# Patient Record
Sex: Female | Born: 2000 | Race: White | Hispanic: No | Marital: Single | Smoking: Never smoker
Health system: Southern US, Community
[De-identification: ages and names within clinical notes are randomized; demographics above are authoritative.]

## PROBLEM LIST (undated history)

## (undated) DIAGNOSIS — J3503 Chronic tonsillitis and adenoiditis: Secondary | ICD-10-CM

---
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
## 2019-01-19 ENCOUNTER — Encounter: Payer: Self-pay | Admitting: *Deleted

## 2019-01-19 ENCOUNTER — Ambulatory Visit (INDEPENDENT_AMBULATORY_CARE_PROVIDER_SITE_OTHER): Payer: Medicaid Other | Admitting: Surgery

## 2019-01-19 ENCOUNTER — Other Ambulatory Visit: Payer: Self-pay

## 2019-01-19 ENCOUNTER — Encounter: Payer: Self-pay | Admitting: Surgery

## 2019-01-19 VITALS — BP 122/80 | HR 80 | Temp 97.8°F | Ht 65.0 in | Wt 208.0 lb

## 2019-01-19 DIAGNOSIS — K8020 Calculus of gallbladder without cholecystitis without obstruction: Secondary | ICD-10-CM | POA: Diagnosis not present

## 2019-01-19 NOTE — H&P (View-Only) (Signed)
  01/19/2019  History of Present Illness: Laura Osborn is a 18 y.o. female presenting for follow up of symptomatic cholelithiasis.  She was seen 03/2018 for initial visit and had wanted to wait until 06/2018 for surgery.  She reports she was too busy with school and working and could not arrange for surgery.  Also, this spring, her symptoms have become more frequent and more severe, with episodes about 1-2 times every 1-2 weeks.  Describes associated nausea, infrequent emesis, and pain in the epigastric and RUQ areas radiating to the back.  Denies any fevers, chills, chest pain, shortness of breath.  Past Medical History: Past Medical History:  Diagnosis Date  . Chronic tonsillitis and adenoiditis      Past Surgical History: Past Surgical History:  Procedure Laterality Date  . TONSILLECTOMY N/A 04/02/2016   Procedure: TONSILLECTOMY;  Surgeon: Paul Bennett, MD;  Location: [facility];  Service: ENT;  Laterality: N/A;  NO ADENOID TISSUE PRESENT    Home Medications: Prior to Admission medications   Medication Sig Start Date End Date Taking? Authorizing Provider  ondansetron (ZOFRAN ODT) 4 MG disintegrating tablet Allow 1-2 tablets to dissolve in your mouth every 8 hours as needed for nausea/vomiting 03/09/18   Forbach, Cory, MD  traMADol (ULTRAM) 50 MG tablet Take 1-2 tablets by mouth every 6 hours as needed for moderate to severe pain 03/09/18   Forbach, Cory, MD    Allergies: Allergies  Allergen Reactions  . Lactose Intolerance (Gi)     Review of Systems: Review of Systems  Constitutional: Negative for chills and fever.  Respiratory: Negative for shortness of breath.   Cardiovascular: Negative for chest pain.  Gastrointestinal: Positive for abdominal pain, nausea and vomiting.    Physical Exam BP 122/80   Pulse 80   Temp 97.8 F (36.6 C) (Skin)   Ht 5' 5" (1.651 m)   Wt 208 lb (94.3 kg)   SpO2 99%   BMI 34.61 kg/m  CONSTITUTIONAL: No acute distress HEENT:   Normocephalic, atraumatic, extraocular motion intact. RESPIRATORY:  Lungs are clear, and breath sounds are equal bilaterally. Normal respiratory effort without pathologic use of accessory muscles. CARDIOVASCULAR: Heart is regular without murmurs, gallops, or rubs. GI: The abdomen is soft, obese, non-distended, with mild soreness in epigastric area.  Negative Murphy's sign.  NEUROLOGIC:  Motor and sensation is grossly normal.  Cranial nerves are grossly intact. PSYCH:  Alert and oriented to person, place and time. Affect is normal.  Labs/Imaging: None recently  Assessment and Plan: This is a 18 y.o. female with symptomatic cholelithiasis.  Discussed with the patient that given the increased frequency and severity of symptoms, we should proceed with cholecystectomy.  Discussed the surgery at length with her, including risks of bleeding, infection, and injury to surrounding structures; post-op restrictions, outcomes, and pain medications.  She is willing to proceed.  She understands that she would need to get tested for COVID-19 prior to surgery and if positive, would need to postpone surgery for 3 weeks.  Will schedule her for 01/25/19.  Face-to-face time spent with the patient and care providers was 25 minutes, with more than 50% of the time spent counseling, educating, and coordinating care of the patient.     Aubrey Blackard Luis Nadiya Pieratt, MD [facility]    

## 2019-01-19 NOTE — Progress Notes (Signed)
Patient's surgery to be scheduled for 01-25-19 at [facility] with Dr. Hampton Abbot.  The patient is aware to have COVID-19 testing done on 01-21-19 at the [facility] drive thru ([address] [place]) between 8:00 am and 10:30 am. She is aware to isolate after, have no visitors, wash hands frequently, and avoid touching face.   The patient is aware she will be contacted by the [place] to complete a phone interview sometime in the near future.  Patient aware to be NPO after midnight and have a driver.   She is aware to check in at the [place] entrance where she will be screened for the coronavirus and then sent to Same Day Surgery.   Patient aware that she may have one visitor due to COVID-19 restrictions.   The patient verbalizes understanding of the above.   The patient is aware to call the office should she have further questions.

## 2019-01-19 NOTE — Progress Notes (Signed)
  01/19/2019  History of Present Illness: Laura Osborn is a 18 y.o. female presenting for follow up of symptomatic cholelithiasis.  She was seen 03/2018 for initial visit and had wanted to wait until 06/2018 for surgery.  She reports she was too busy with school and working and could not arrange for surgery.  Also, this spring, her symptoms have become more frequent and more severe, with episodes about 1-2 times every 1-2 weeks.  Describes associated nausea, infrequent emesis, and pain in the epigastric and RUQ areas radiating to the back.  Denies any fevers, chills, chest pain, shortness of breath.  Past Medical History: Past Medical History:  Diagnosis Date  . Chronic tonsillitis and adenoiditis      Past Surgical History: Past Surgical History:  Procedure Laterality Date  . TONSILLECTOMY N/A 04/02/2016   Procedure: TONSILLECTOMY;  Surgeon: Clyde Canterbury, MD;  Location: [facility];  Service: ENT;  Laterality: N/A;  NO ADENOID TISSUE PRESENT    Home Medications: Prior to Admission medications   Medication Sig Start Date End Date Taking? Authorizing Provider  ondansetron (ZOFRAN ODT) 4 MG disintegrating tablet Allow 1-2 tablets to dissolve in your mouth every 8 hours as needed for nausea/vomiting 03/09/18   Hinda Kehr, MD  traMADol Veatrice Bourbon) 50 MG tablet Take 1-2 tablets by mouth every 6 hours as needed for moderate to severe pain 03/09/18   Hinda Kehr, MD    Allergies: Allergies  Allergen Reactions  . Lactose Intolerance (Gi)     Review of Systems: Review of Systems  Constitutional: Negative for chills and fever.  Respiratory: Negative for shortness of breath.   Cardiovascular: Negative for chest pain.  Gastrointestinal: Positive for abdominal pain, nausea and vomiting.    Physical Exam BP 122/80   Pulse 80   Temp 97.8 F (36.6 C) (Skin)   Ht 5\' 5"  (1.651 m)   Wt 208 lb (94.3 kg)   SpO2 99%   BMI 34.61 kg/m  CONSTITUTIONAL: No acute distress HEENT:   Normocephalic, atraumatic, extraocular motion intact. RESPIRATORY:  Lungs are clear, and breath sounds are equal bilaterally. Normal respiratory effort without pathologic use of accessory muscles. CARDIOVASCULAR: Heart is regular without murmurs, gallops, or rubs. GI: The abdomen is soft, obese, non-distended, with mild soreness in epigastric area.  Negative Murphy's sign.  NEUROLOGIC:  Motor and sensation is grossly normal.  Cranial nerves are grossly intact. PSYCH:  Alert and oriented to person, place and time. Affect is normal.  Labs/Imaging: None recently  Assessment and Plan: This is a 18 y.o. female with symptomatic cholelithiasis.  Discussed with the patient that given the increased frequency and severity of symptoms, we should proceed with cholecystectomy.  Discussed the surgery at length with her, including risks of bleeding, infection, and injury to surrounding structures; post-op restrictions, outcomes, and pain medications.  She is willing to proceed.  She understands that she would need to get tested for COVID-19 prior to surgery and if positive, would need to postpone surgery for 3 weeks.  Will schedule her for 01/25/19.  Face-to-face time spent with the patient and care providers was 25 minutes, with more than 50% of the time spent counseling, educating, and coordinating care of the patient.     Melvyn Neth, [facility]

## 2019-01-19 NOTE — Patient Instructions (Signed)

## 2019-01-20 ENCOUNTER — Encounter
Admission: RE | Admit: 2019-01-20 | Discharge: 2019-01-20 | Disposition: A | Discharge status: Home / Self Care | Payer: Medicaid Other | Source: Ambulatory Visit | Attending: Surgery | Admitting: Surgery

## 2019-01-20 ENCOUNTER — Other Ambulatory Visit: Payer: Self-pay

## 2019-01-20 NOTE — Patient Instructions (Signed)
Your procedure is scheduled on: 01/25/19 Report to Day Surgery.[facility]LO[facility] To find out your arrival time please call [phone] between 1PM - 3PM on 01/22/19.  Remember: Instructions that are not followed completely may result in serious medical risk,  up to and including death, or upon the discretion of your surgeon and anesthesiologist your  surgery may need to be rescheduled.     _X__ 1. Do not eat food after midnight the night before your procedure.                 No gum chewing or hard candies. You may drink clear liquids up to 2 hours                 before you are scheduled to arrive for your surgery- DO not drink clear                 liquids within 2 hours of the start of your surgery.                 Clear Liquids include:  water, apple juice without pulp, clear carbohydrate                 drink such as Clearfast of Gatorade, Black Coffee or Tea (Do not add                 anything to coffee or tea).  __X__2.  On the morning of surgery brush your teeth with toothpaste and water, you                may rinse your mouth with mouthwash if you wish.  Do not swallow any toothpaste of mouthwash.     _X__ 3.  No Alcohol for 24 hours before or after surgery.   _X__ 4.  Do Not Smoke or use e-cigarettes For 24 Hours Prior to Your Surgery.                 Do not use any chewable tobacco products for at least 6 hours prior to                 surgery.  ____  5.  Bring all medications with you on the day of surgery if instructed.   X____  6.  Notify your doctor if there is any change in your medical condition      (cold, fever, infections).     Do not wear jewelry, make-up, hairpins, clips or nail polish. Do not wear lotions, powders, or perfumes. You may wear deodorant. Do not shave 48 hours prior to surgery. Men may shave face and neck. Do not bring valuables to the hospital.    [facility] is not responsible for any belongings or  valuables.  Contacts, dentures or bridgework may not be worn into surgery. Leave your suitcase in the car. After surgery it may be brought to your room. For patients admitted to the hospital, discharge time is determined by your treatment team.   Patients discharged the day of surgery will not be allowed to drive home.   Please read over the following fact sheets that you were given:   Surgical Site Infection Prevention          ____ Take these medicines the morning of surgery with A SIP OF WATER:    1. NONE  2.   3.   4.  5.  6.  ____ Fleet Enema (as directed)   _X___  Use CHG Soap as directed  ____ Use inhalers on the day of surgery  ____ Stop metformin 2 days prior to surgery    ____ Take 1/2 of usual insulin dose the night before surgery. No insulin the morning          of surgery.   ____ Stop Coumadin/Plavix/aspirin on   ____ Stop Anti-inflammatories on    ____ Stop supplements until after surgery.    ____ Bring C-Pap to the hospital.

## 2019-01-21 ENCOUNTER — Other Ambulatory Visit
Admission: RE | Admit: 2019-01-21 | Discharge: 2019-01-21 | Disposition: A | Discharge status: Home / Self Care | Payer: Medicaid Other | Source: Ambulatory Visit | Attending: Surgery | Admitting: Surgery

## 2019-01-21 DIAGNOSIS — Z1159 Encounter for screening for other viral diseases: Secondary | ICD-10-CM | POA: Insufficient documentation

## 2019-01-21 LAB — SARS CORONAVIRUS 2 (TAT 6-24 HRS): SARS Coronavirus 2: NEGATIVE

## 2019-01-22 ENCOUNTER — Encounter: Payer: Self-pay | Admitting: Anesthesiology

## 2019-01-24 MED ORDER — CEFAZOLIN SODIUM-DEXTROSE 2-4 GM/100ML-% IV SOLN
2.0000 g | INTRAVENOUS | Status: AC
  Administered 2019-01-25: 2 g via INTRAVENOUS

## 2019-01-25 ENCOUNTER — Encounter
Admission: RE | Disposition: A | Discharge status: Home / Self Care | Payer: Self-pay | Source: Home / Self Care | Attending: Surgery

## 2019-01-25 ENCOUNTER — Ambulatory Visit: Payer: Medicaid Other | Admitting: Anesthesiology

## 2019-01-25 ENCOUNTER — Encounter: Payer: Self-pay | Admitting: Certified Registered Nurse Anesthetist

## 2019-01-25 ENCOUNTER — Ambulatory Visit
Admission: RE | Admit: 2019-01-25 | Discharge: 2019-01-25 | Disposition: A | Discharge status: Home / Self Care | Payer: Medicaid Other | Attending: Surgery | Admitting: Surgery

## 2019-01-25 ENCOUNTER — Other Ambulatory Visit: Payer: Self-pay

## 2019-01-25 DIAGNOSIS — K8020 Calculus of gallbladder without cholecystitis without obstruction: Secondary | ICD-10-CM | POA: Diagnosis present

## 2019-01-25 DIAGNOSIS — Z6834 Body mass index (BMI) 34.0-34.9, adult: Secondary | ICD-10-CM | POA: Insufficient documentation

## 2019-01-25 DIAGNOSIS — E669 Obesity, unspecified: Secondary | ICD-10-CM | POA: Insufficient documentation

## 2019-01-25 DIAGNOSIS — K8010 Calculus of gallbladder with chronic cholecystitis without obstruction: Secondary | ICD-10-CM | POA: Diagnosis not present

## 2019-01-25 DIAGNOSIS — E739 Lactose intolerance, unspecified: Secondary | ICD-10-CM | POA: Diagnosis not present

## 2019-01-25 HISTORY — PX: CHOLECYSTECTOMY: SHX55

## 2019-01-25 LAB — POCT PREGNANCY, URINE: Preg Test, Ur: NEGATIVE

## 2019-01-25 SURGERY — LAPAROSCOPIC CHOLECYSTECTOMY
Anesthesia: General

## 2019-01-25 MED ORDER — SUGAMMADEX SODIUM 200 MG/2ML IV SOLN
INTRAVENOUS | Status: DC | PRN
  Administered 2019-01-25: 200 mg via INTRAVENOUS

## 2019-01-25 MED ORDER — FENTANYL CITRATE (PF) 100 MCG/2ML IJ SOLN
INTRAMUSCULAR | Status: DC | PRN
  Administered 2019-01-25: 50 ug via INTRAVENOUS
  Administered 2019-01-25: 100 ug via INTRAVENOUS
  Administered 2019-01-25: 50 ug via INTRAVENOUS

## 2019-01-25 MED ORDER — LIDOCAINE HCL (PF) 2 % IJ SOLN
INTRAMUSCULAR | Status: AC
  Filled 2019-01-25: qty 10

## 2019-01-25 MED ORDER — DEXMEDETOMIDINE HCL 200 MCG/2ML IV SOLN
INTRAVENOUS | Status: DC | PRN
  Administered 2019-01-25 (×2): 8 ug via INTRAVENOUS

## 2019-01-25 MED ORDER — CEFAZOLIN SODIUM-DEXTROSE 2-4 GM/100ML-% IV SOLN
INTRAVENOUS | Status: AC
  Filled 2019-01-25: qty 100

## 2019-01-25 MED ORDER — FAMOTIDINE 20 MG PO TABS
ORAL_TABLET | ORAL | Status: AC
  Administered 2019-01-25: 08:00:00 20 mg via ORAL
  Filled 2019-01-25: qty 1

## 2019-01-25 MED ORDER — LACTATED RINGERS IV SOLN
INTRAVENOUS | Status: DC
  Administered 2019-01-25: 08:00:00 via INTRAVENOUS

## 2019-01-25 MED ORDER — PROPOFOL 10 MG/ML IV BOLUS
INTRAVENOUS | Status: DC | PRN
  Administered 2019-01-25: 160 mg via INTRAVENOUS

## 2019-01-25 MED ORDER — GABAPENTIN 300 MG PO CAPS
ORAL_CAPSULE | ORAL | Status: AC
  Administered 2019-01-25: 300 mg via ORAL
  Filled 2019-01-25: qty 1

## 2019-01-25 MED ORDER — ROCURONIUM BROMIDE 50 MG/5ML IV SOLN
INTRAVENOUS | Status: AC
  Filled 2019-01-25: qty 1

## 2019-01-25 MED ORDER — EPHEDRINE SULFATE 50 MG/ML IJ SOLN
INTRAMUSCULAR | Status: DC | PRN
  Administered 2019-01-25: 5 mg via INTRAVENOUS
  Administered 2019-01-25: 10 mg via INTRAVENOUS

## 2019-01-25 MED ORDER — ACETAMINOPHEN 500 MG PO TABS
1000.0000 mg | ORAL_TABLET | ORAL | Status: AC
  Administered 2019-01-25: 1000 mg via ORAL

## 2019-01-25 MED ORDER — FENTANYL CITRATE (PF) 100 MCG/2ML IJ SOLN
INTRAMUSCULAR | Status: AC
  Administered 2019-01-25: 25 ug via INTRAVENOUS
  Filled 2019-01-25: qty 2

## 2019-01-25 MED ORDER — ACETAMINOPHEN 500 MG PO TABS
ORAL_TABLET | ORAL | Status: AC
  Administered 2019-01-25: 08:00:00 1000 mg via ORAL
  Filled 2019-01-25: qty 2

## 2019-01-25 MED ORDER — OXYCODONE HCL 5 MG PO TABS: 5.0000 mg | ORAL_TABLET | ORAL | 0 refills | Status: DC | PRN

## 2019-01-25 MED ORDER — PROPOFOL 10 MG/ML IV BOLUS
INTRAVENOUS | Status: AC
  Filled 2019-01-25: qty 20

## 2019-01-25 MED ORDER — MIDAZOLAM HCL 2 MG/2ML IJ SOLN
INTRAMUSCULAR | Status: DC | PRN
  Administered 2019-01-25: 2 mg via INTRAVENOUS

## 2019-01-25 MED ORDER — EPHEDRINE SULFATE 50 MG/ML IJ SOLN
INTRAMUSCULAR | Status: AC
  Filled 2019-01-25: qty 1

## 2019-01-25 MED ORDER — LIDOCAINE HCL 4 % MT SOLN
OROMUCOSAL | Status: DC | PRN
  Administered 2019-01-25: 4 mL via TOPICAL

## 2019-01-25 MED ORDER — BUPIVACAINE-EPINEPHRINE (PF) 0.25% -1:200000 IJ SOLN
INTRAMUSCULAR | Status: AC
  Filled 2019-01-25: qty 30

## 2019-01-25 MED ORDER — ONDANSETRON HCL 4 MG/2ML IJ SOLN
4.0000 mg | Freq: Once | INTRAMUSCULAR | Status: AC | PRN
  Administered 2019-01-25: 4 mg via INTRAVENOUS

## 2019-01-25 MED ORDER — KETOROLAC TROMETHAMINE 30 MG/ML IJ SOLN
INTRAMUSCULAR | Status: AC
  Filled 2019-01-25: qty 1

## 2019-01-25 MED ORDER — IBUPROFEN 600 MG PO TABS: 600.0000 mg | ORAL_TABLET | Freq: Three times a day (TID) | ORAL | 0 refills | Status: AC | PRN

## 2019-01-25 MED ORDER — ROCURONIUM BROMIDE 100 MG/10ML IV SOLN
INTRAVENOUS | Status: DC | PRN
  Administered 2019-01-25: 50 mg via INTRAVENOUS
  Administered 2019-01-25: 20 mg via INTRAVENOUS

## 2019-01-25 MED ORDER — LIDOCAINE HCL (CARDIAC) PF 100 MG/5ML IV SOSY
PREFILLED_SYRINGE | INTRAVENOUS | Status: DC | PRN
  Administered 2019-01-25: 100 mg via INTRAVENOUS

## 2019-01-25 MED ORDER — DEXAMETHASONE SODIUM PHOSPHATE 10 MG/ML IJ SOLN
INTRAMUSCULAR | Status: DC | PRN
  Administered 2019-01-25: 10 mg via INTRAVENOUS

## 2019-01-25 MED ORDER — DEXAMETHASONE SODIUM PHOSPHATE 10 MG/ML IJ SOLN
INTRAMUSCULAR | Status: AC
  Filled 2019-01-25: qty 1

## 2019-01-25 MED ORDER — ONDANSETRON HCL 4 MG/2ML IJ SOLN
INTRAMUSCULAR | Status: AC
  Filled 2019-01-25: qty 2

## 2019-01-25 MED ORDER — CHLORHEXIDINE GLUCONATE CLOTH 2 % EX PADS: 6.0000 | MEDICATED_PAD | Freq: Once | CUTANEOUS | Status: DC

## 2019-01-25 MED ORDER — BUPIVACAINE-EPINEPHRINE (PF) 0.25% -1:200000 IJ SOLN
INTRAMUSCULAR | Status: DC | PRN
  Administered 2019-01-25: 30 mL via PERINEURAL

## 2019-01-25 MED ORDER — KETOROLAC TROMETHAMINE 30 MG/ML IJ SOLN
INTRAMUSCULAR | Status: DC | PRN
  Administered 2019-01-25: 30 mg via INTRAVENOUS

## 2019-01-25 MED ORDER — FENTANYL CITRATE (PF) 100 MCG/2ML IJ SOLN
25.0000 ug | INTRAMUSCULAR | Status: DC | PRN
  Administered 2019-01-25 (×2): 25 ug via INTRAVENOUS

## 2019-01-25 MED ORDER — GABAPENTIN 300 MG PO CAPS
300.0000 mg | ORAL_CAPSULE | ORAL | Status: AC
  Administered 2019-01-25: 300 mg via ORAL

## 2019-01-25 MED ORDER — MIDAZOLAM HCL 2 MG/2ML IJ SOLN
INTRAMUSCULAR | Status: AC
  Filled 2019-01-25: qty 2

## 2019-01-25 MED ORDER — SUGAMMADEX SODIUM 200 MG/2ML IV SOLN
INTRAVENOUS | Status: AC
  Filled 2019-01-25: qty 2

## 2019-01-25 MED ORDER — DEXMEDETOMIDINE HCL IN NACL 80 MCG/20ML IV SOLN
INTRAVENOUS | Status: AC
  Filled 2019-01-25: qty 20

## 2019-01-25 MED ORDER — FAMOTIDINE 20 MG PO TABS
20.0000 mg | ORAL_TABLET | Freq: Once | ORAL | Status: AC
  Administered 2019-01-25: 20 mg via ORAL

## 2019-01-25 MED ORDER — ONDANSETRON HCL 4 MG/2ML IJ SOLN
INTRAMUSCULAR | Status: DC | PRN
  Administered 2019-01-25: 4 mg via INTRAVENOUS

## 2019-01-25 MED ORDER — SEVOFLURANE IN SOLN
RESPIRATORY_TRACT | Status: AC
  Filled 2019-01-25: qty 250

## 2019-01-25 MED ORDER — FENTANYL CITRATE (PF) 250 MCG/5ML IJ SOLN
INTRAMUSCULAR | Status: AC
  Filled 2019-01-25: qty 5

## 2019-01-25 SURGICAL SUPPLY — 44 items
APPLIER CLIP 5 13 M/L LIGAMAX5 (MISCELLANEOUS) ×3
BLADE SURG 15 STRL LF DISP TIS (BLADE) ×1 IMPLANT
BLADE SURG 15 STRL SS (BLADE) ×2
CANISTER SUCT 1200ML W/VALVE (MISCELLANEOUS) ×3 IMPLANT
CATH CHOLANGI 4FR 420404F (CATHETERS) IMPLANT
CHLORAPREP W/TINT 26 (MISCELLANEOUS) ×3 IMPLANT
CLIP APPLIE 5 13 M/L LIGAMAX5 (MISCELLANEOUS) ×1 IMPLANT
CONRAY 60ML FOR OR (MISCELLANEOUS) ×1 IMPLANT
COVER WAND RF STERILE (DRAPES) ×3 IMPLANT
DERMABOND ADVANCED (GAUZE/BANDAGES/DRESSINGS) ×2
DERMABOND ADVANCED .7 DNX12 (GAUZE/BANDAGES/DRESSINGS) ×1 IMPLANT
DRAPE C-ARM XRAY 36X54 (DRAPES) IMPLANT
ELECT REM PT RETURN 9FT ADLT (ELECTROSURGICAL) ×3
ELECTRODE REM PT RTRN 9FT ADLT (ELECTROSURGICAL) ×1 IMPLANT
GLOVE SURG SYN 7.0 (GLOVE) ×3 IMPLANT
GLOVE SURG SYN 7.0 PF PI (GLOVE) ×1 IMPLANT
GLOVE SURG SYN 7.5  E (GLOVE) ×2
GLOVE SURG SYN 7.5 E (GLOVE) ×1 IMPLANT
GLOVE SURG SYN 7.5 PF PI (GLOVE) ×1 IMPLANT
GOWN STRL REUS W/ TWL LRG LVL3 (GOWN DISPOSABLE) ×3 IMPLANT
GOWN STRL REUS W/TWL LRG LVL3 (GOWN DISPOSABLE) ×6
IRRIGATION STRYKERFLOW (MISCELLANEOUS) IMPLANT
IRRIGATOR STRYKERFLOW (MISCELLANEOUS) ×3
IV CATH ANGIO 12GX3 LT BLUE (NEEDLE) ×1 IMPLANT
IV NS 1000ML (IV SOLUTION)
IV NS 1000ML BAXH (IV SOLUTION) IMPLANT
JACKSON PRATT 10 (INSTRUMENTS) IMPLANT
L-HOOK LAP DISP 36CM (ELECTROSURGICAL) ×3
LABEL OR SOLS (LABEL) ×3 IMPLANT
LHOOK LAP DISP 36CM (ELECTROSURGICAL) ×1 IMPLANT
NEEDLE HYPO 22GX1.5 SAFETY (NEEDLE) ×6 IMPLANT
PACK LAP CHOLECYSTECTOMY (MISCELLANEOUS) ×3 IMPLANT
PENCIL ELECTRO HAND CTR (MISCELLANEOUS) ×3 IMPLANT
POUCH SPECIMEN RETRIEVAL 10MM (ENDOMECHANICALS) ×3 IMPLANT
SCISSORS METZENBAUM CVD 33 (INSTRUMENTS) ×1 IMPLANT
SET TUBE SMOKE EVAC HIGH FLOW (TUBING) ×3 IMPLANT
SLEEVE ADV FIXATION 5X100MM (TROCAR) ×9 IMPLANT
SPONGE VERSALON 4X4 4PLY (MISCELLANEOUS) IMPLANT
SUT MNCRL 4-0 (SUTURE) ×2
SUT MNCRL 4-0 27XMFL (SUTURE) ×1
SUT VICRYL 0 AB UR-6 (SUTURE) ×5 IMPLANT
SUTURE MNCRL 4-0 27XMF (SUTURE) ×1 IMPLANT
TROCAR BALLN GELPORT 12X130M (ENDOMECHANICALS) ×3 IMPLANT
TROCAR Z-THREAD OPTICAL 5X100M (TROCAR) ×3 IMPLANT

## 2019-01-25 NOTE — Anesthesia Preprocedure Evaluation (Signed)
Anesthesia Evaluation  Patient identified by MRN, date of birth, ID band Patient awake    Reviewed: Allergy & Precautions, NPO status , Patient's Chart, lab work & pertinent test results, reviewed documented beta blocker date and time   Airway Mallampati: III  TM Distance: >3 FB     Dental  (+) Chipped   Pulmonary           Cardiovascular      Neuro/Psych    GI/Hepatic   Endo/Other    Renal/GU      Musculoskeletal   Abdominal   Peds  Hematology   Anesthesia Other Findings Obese.  Reproductive/Obstetrics                             Anesthesia Physical Anesthesia Plan  ASA: II  Anesthesia Plan: General   Post-op Pain Management:    Induction: Intravenous  PONV Risk Score and Plan:   Airway Management Planned: Oral ETT  Additional Equipment:   Intra-op Plan:   Post-operative Plan:   Informed Consent: I have reviewed the patients History and Physical, chart, labs and discussed the procedure including the risks, benefits and alternatives for the proposed anesthesia with the patient or authorized representative who has indicated his/her understanding and acceptance.       Plan Discussed with: CRNA  Anesthesia Plan Comments:         Anesthesia Quick Evaluation  

## 2019-01-25 NOTE — Interval H&P Note (Signed)
History and Physical Interval Note:  01/25/2019 8:39 AM  Laura Osborn  has presented today for surgery, with the diagnosis of K80.20 SYMTOMATIC CHOLELITHIASIS.  The various methods of treatment have been discussed with the patient and family. After consideration of risks, benefits and other options for treatment, the patient has consented to  Procedure(s): LAPAROSCOPIC CHOLECYSTECTOMY (N/A) as a surgical intervention.  The patient's history has been reviewed, patient examined, no change in status, stable for surgery.  I have reviewed the patient's chart and labs.  Questions were answered to the patient's satisfaction.     Allyson Tineo

## 2019-01-25 NOTE — Anesthesia Postprocedure Evaluation (Signed)
Anesthesia Post Note  Patient: Laura Osborn  Procedure(s) Performed: LAPAROSCOPIC CHOLECYSTECTOMY (N/A )  Patient location during evaluation: PACU Anesthesia Type: General Level of consciousness: awake and alert Pain management: pain level controlled Vital Signs Assessment: post-procedure vital signs reviewed and stable Respiratory status: spontaneous breathing, nonlabored ventilation, respiratory function stable and patient connected to nasal cannula oxygen Cardiovascular status: blood pressure returned to baseline and stable Postop Assessment: no apparent nausea or vomiting Anesthetic complications: no     Last Vitals:  Vitals:   01/25/19 1213 01/25/19 1240  BP: 134/79 124/82  Pulse: 91 88  Resp: 16 18  Temp: 36.8 C   SpO2: 97% 98%    Last Pain:  Vitals:   01/25/19 1240  TempSrc:   PainSc: 2                  Keonta Alsip S

## 2019-01-25 NOTE — Progress Notes (Signed)
Ch visited pt in pre-op. Pt shared that she is having her gallbladder removed. Ch provided words of encouragement and comfort for pt. 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 Pt is hopeful that she will be healed from the surgery before returning. Ch provided compassionate presence for pt who seemed to have a positive affect.   No further needs at this time.     01/25/19 0900  Clinical Encounter Type  Visited With Patient  Visit Type Pre-op;Social support  Spiritual Encounters  Spiritual Needs Emotional;Grief support  Stress Factors  Patient Stress Factors Health changes;Major life changes  Family Stress Factors None identified

## 2019-01-25 NOTE — Op Note (Signed)
  Procedure Date:  01/25/2019  Pre-operative Diagnosis:  Symptomatic cholelithiasis  Post-operative Diagnosis:  Symptomatic cholelithiasis  Procedure:  Laparoscopic cholecystectomy  Surgeon:  Melvyn Neth, MD  Anesthesia:  General endotracheal  Estimated Blood Loss:  10 ml  Specimens:  gallbladder  Complications:  None  Indications for Procedure:  This is a 18 y.o. female who presents with abdominal pain and workup revealing symptomatic cholelithiasis.  The benefits, complications, treatment options, and expected outcomes were discussed with the patient. The risks of bleeding, infection, recurrence of symptoms, failure to resolve symptoms, bile duct damage, bile duct leak, retained common bile duct stone, bowel injury, and need for further procedures were all discussed with the patient and she was willing to proceed.  Description of Procedure: The patient was correctly identified in the preoperative area and brought into the operating room.  The patient was placed supine with VTE prophylaxis in place.  Appropriate time-outs were performed.  Anesthesia was induced and the patient was intubated.  Appropriate antibiotics were infused.  The abdomen was prepped and draped in a sterile fashion. An infraumbilical incision was made. A cutdown technique was used to enter the abdominal cavity without injury, and a Hasson trocar was inserted.  Pneumoperitoneum was obtained with appropriate opening pressures.  A 5-mm port was placed in the subxiphoid area and two 5-mm ports were placed in the right upper quadrant under direct visualization.  The gallbladder was identified.  The fundus was grasped and retracted cephalad.  Adhesions were lysed bluntly and with electrocautery. The infundibulum was grasped and retracted laterally, exposing the peritoneum overlying the gallbladder.  This was incised with electrocautery and extended on either side of the gallbladder.  The cystic duct and cystic artery  were clearly identified and bluntly dissected.  Both were clipped twice proximally and once distally, cutting in between.  The gallbladder was taken from the gallbladder fossa in a retrograde fashion with electrocautery. There was mild spillage of bile from the gallbladder, which was suctioned out.  The gallbladder was placed in an Endocatch bag. The liver bed was inspected and any bleeding was controlled with electrocautery. The right upper quadrant was then inspected again revealing intact clips, no bleeding, and no ductal injury.  The area was thoroughly irrigated.  The 5 mm ports were removed under direct visualization and the Hasson trocar was removed.  The Endocatch bag was brought out via the umbilical incision. The fascial opening was closed using 0 vicryl suture.  Local anesthetic was infused in all incisions and the incisions were closed with 4-0 Monocryl, with addition of 3-0 Vicryl to the umbilical wound deep layer.  The wounds were cleaned and sealed with DermaBond.  The patient was emerged from anesthesia and extubated and brought to the recovery room for further management.  The patient tolerated the procedure well and all counts were correct at the end of the case.   Melvyn Neth, MD

## 2019-01-25 NOTE — Transfer of Care (Signed)
Immediate Anesthesia Transfer of Care Note  Patient: Laura Osborn  Procedure(s) Performed: LAPAROSCOPIC CHOLECYSTECTOMY (N/A )  Patient Location: PACU  Anesthesia Type:General  Level of Consciousness: awake, alert , oriented and patient cooperative  Airway & Oxygen Therapy: Patient Spontanous Breathing and Patient connected to face mask oxygen  Post-op Assessment: Report given to RN and Post -op Vital signs reviewed and stable  Post vital signs: Reviewed and stable  Last Vitals:  Vitals Value Taken Time  BP 137/76 01/25/19 1048  Temp    Pulse 117 01/25/19 1051  Resp 16 01/25/19 1051  SpO2 100 % 01/25/19 1051  Vitals shown include unvalidated device data.  Last Pain:  Vitals:   01/25/19 0752  TempSrc: Temporal  PainSc: 0-No pain         Complications: No apparent anesthesia complications

## 2019-01-25 NOTE — Anesthesia Procedure Notes (Signed)
Procedure Name: Intubation Date/Time: 01/25/2019 9:28 AM Performed by: Eben Burow, CRNA Pre-anesthesia Checklist: Patient identified, Emergency Drugs available, Suction available and Patient being monitored Patient Re-evaluated:Patient Re-evaluated prior to induction Oxygen Delivery Method: Circle system utilized Preoxygenation: Pre-oxygenation with 100% oxygen Induction Type: IV induction Ventilation: Mask ventilation without difficulty Laryngoscope Size: Miller and 2 Grade View: Grade I Tube type: Oral Tube size: 7.0 mm Number of attempts: 1 Airway Equipment and Method: Stylet and LTA kit utilized Placement Confirmation: ETT inserted through vocal cords under direct vision,  positive ETCO2 and breath sounds checked- equal and bilateral Secured at: 21 cm Tube secured with: Tape Dental Injury: Teeth and Oropharynx as per pre-operative assessment

## 2019-01-25 NOTE — Anesthesia Post-op Follow-up Note (Signed)
Anesthesia QCDR form completed.        

## 2019-01-25 NOTE — Addendum Note (Signed)
Addendum  created 01/25/19 1320 by Eben Burow, CRNA   Intraprocedure Flowsheets edited

## 2019-01-25 NOTE — Discharge Instructions (Signed)

## 2019-01-26 LAB — SURGICAL PATHOLOGY

## 2019-02-09 ENCOUNTER — Encounter: Payer: Medicaid Other | Admitting: Surgery

## 2019-02-10 ENCOUNTER — Encounter: Payer: Self-pay | Admitting: Surgery

## 2019-02-10 ENCOUNTER — Other Ambulatory Visit: Payer: Self-pay

## 2019-02-10 ENCOUNTER — Ambulatory Visit (INDEPENDENT_AMBULATORY_CARE_PROVIDER_SITE_OTHER): Payer: Medicaid Other | Admitting: Surgery

## 2019-02-10 VITALS — BP 118/81 | HR 76 | Temp 97.7°F | Resp 16 | Ht 65.0 in | Wt 206.0 lb

## 2019-02-10 DIAGNOSIS — K8020 Calculus of gallbladder without cholecystitis without obstruction: Secondary | ICD-10-CM

## 2019-02-10 DIAGNOSIS — Z09 Encounter for follow-up examination after completed treatment for conditions other than malignant neoplasm: Secondary | ICD-10-CM

## 2019-02-10 NOTE — Patient Instructions (Addendum)
The patient is aware to call back for any questions or new concerns.  GENERAL POST-OPERATIVE PATIENT INSTRUCTIONS   WOUND CARE INSTRUCTIONS:  Keep a dry clean dressing on the wound if there is drainage. The initial bandage may be removed after 24 hours.  Once the wound has quit draining you may leave it open to air.  If clothing rubs against the wound or causes irritation and the wound is not draining you may cover it with a dry dressing during the daytime.  Try to keep the wound dry and avoid ointments on the wound unless directed to do so.  If the wound becomes bright red and painful or starts to drain infected material that is not clear, please contact your physician immediately.  If the wound is mildly pink and has a thick firm ridge underneath it, this is normal, and is referred to as a healing ridge.  This will resolve over the next 4-6 weeks.  BATHING: You may shower if you have been informed of this by your surgeon. However, Please do not submerge in a tub, hot tub, or pool until incisions are completely sealed or have been told by your surgeon that you may do so.  DIET:  You may eat any foods that you can tolerate.  It is a good idea to eat a high fiber diet and take in plenty of fluids to prevent constipation.  If you do become constipated you may want to take a mild laxative or take ducolax tablets on a daily basis until your bowel habits are regular.  Constipation can be very uncomfortable, along with straining, after recent surgery.  ACTIVITY:  You are encouraged to cough and deep breath or use your incentive spirometer if you were given one, every 15-30 minutes when awake.  This will help prevent respiratory complications and low grade fevers post-operatively if you had a general anesthetic.  You may want to hug a pillow when coughing and sneezing to add additional support to the surgical area, if you had abdominal or chest surgery, which will decrease pain during these times.  You are  encouraged to walk and engage in light activity for the next two weeks.   Twenty pounds is roughly equivalent to a plastic bag of groceries. At that time- Listen to your body when lifting, if you have pain when lifting, stop and then try again in a few days. Soreness after doing exercises or activities of daily living is normal as you get back in to your normal routine.  MEDICATIONS:  Try to take narcotic medications and anti-inflammatory medications, such as tylenol, ibuprofen, naprosyn, etc., with food.  This will minimize stomach upset from the medication.  Should you develop nausea and vomiting from the pain medication, or develop a rash, please discontinue the medication and contact your physician.  You should not drive, make important decisions, or operate machinery when taking narcotic pain medication.  SUNBLOCK Use sun block to incision area over the next year if this area will be exposed to sun. This helps decrease scarring and will allow you avoid a permanent darkened area over your incision.  QUESTIONS:  Please feel free to call our office if you have any questions, and we will be glad to assist you.

## 2019-02-10 NOTE — Progress Notes (Signed)
02/10/2019  HPI: Laura Osborn is a 18 y.o. female s/p laparoscopic cholecystectomy on 01/25/19.  She presents for follow up.  Reports she's been doing well without any pain, nausea, vomiting.  Tolerating a diet.  Vital signs: BP 118/81   Pulse 76   Temp 97.7 F (36.5 C) (Temporal)   Resp 16   Ht 5\' 5"  (1.651 m)   Wt 206 lb (93.4 kg)   LMP 01/13/2019 Comment: iud  BMI 34.28 kg/m    Physical Exam: Constitutional: No acute distress Abdomen:  Soft, obese, non-distended, non-tender to palpation.  Incisions are clean, dry, intact.  No evidence of infection  Assessment/Plan: This is a 18 y.o. female s/p laparoscopic cholecystectomy  --Patient healing well, no issues. --Reviewed pathology with patient. --Follow up prn   Melvyn Neth, [place]

## 2019-03-19 IMAGING — US US ABDOMEN LIMITED
1 series · 14 of 25 positions shown · non-contrast
Comparison: None.

CLINICAL DATA: 17-year-old female with right upper quadrant
abdominal pain.

EXAM:
ULTRASOUND ABDOMEN LIMITED RIGHT UPPER QUADRANT

[Series 1: us abdomen limited · 14 of 69 slices shown]
[im 1/69]
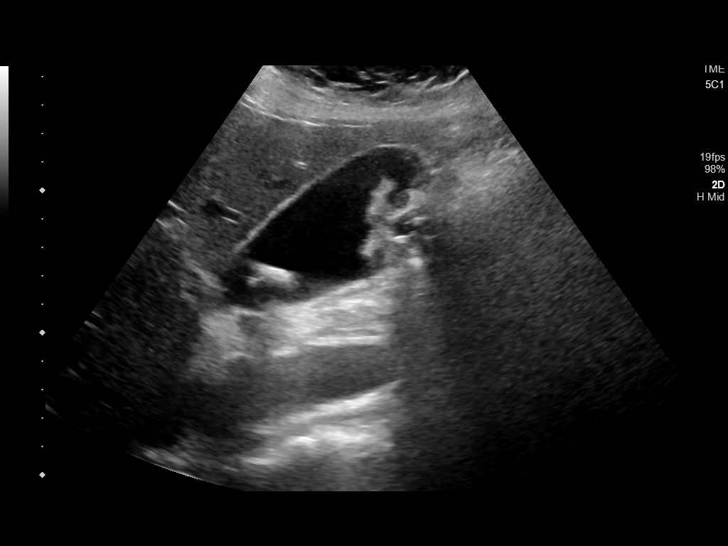
[im 6/69]
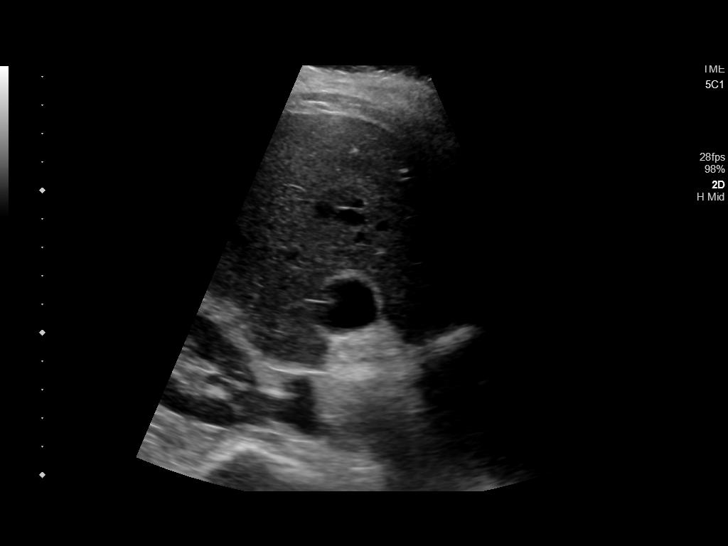
[im 12/69]
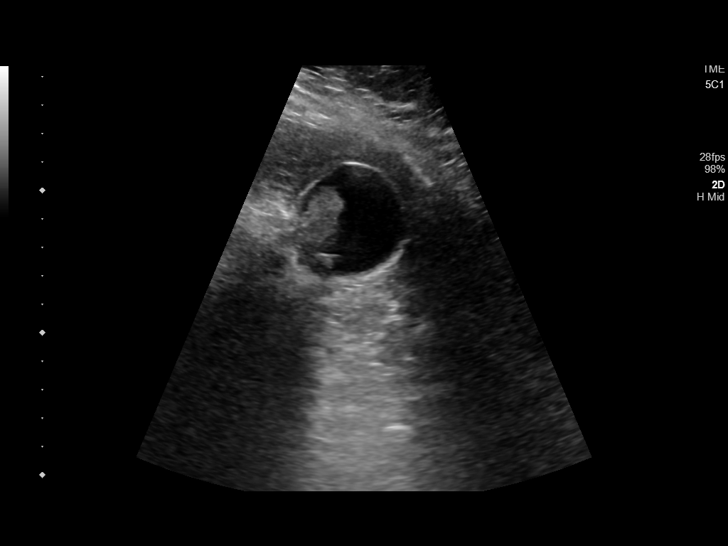
[im 18/69]
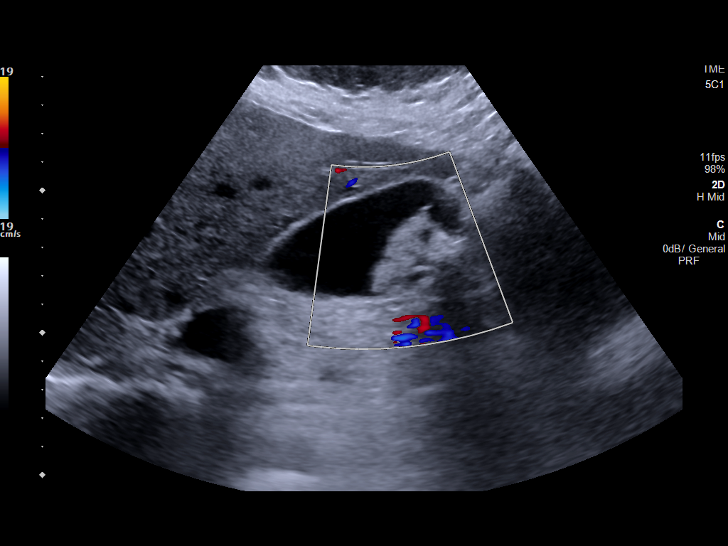
[im 23/69]
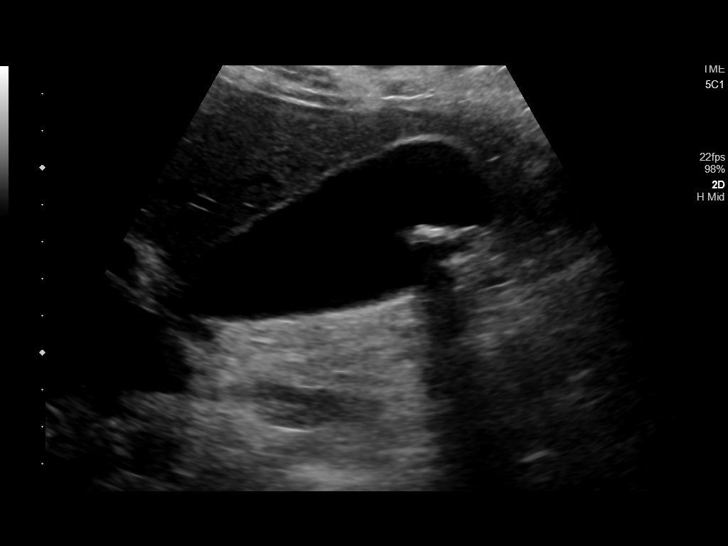
[im 26/69]
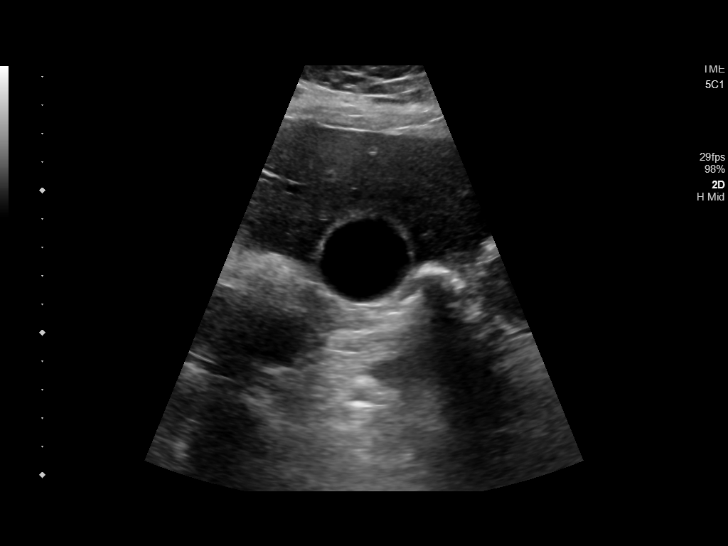
[im 32/69]
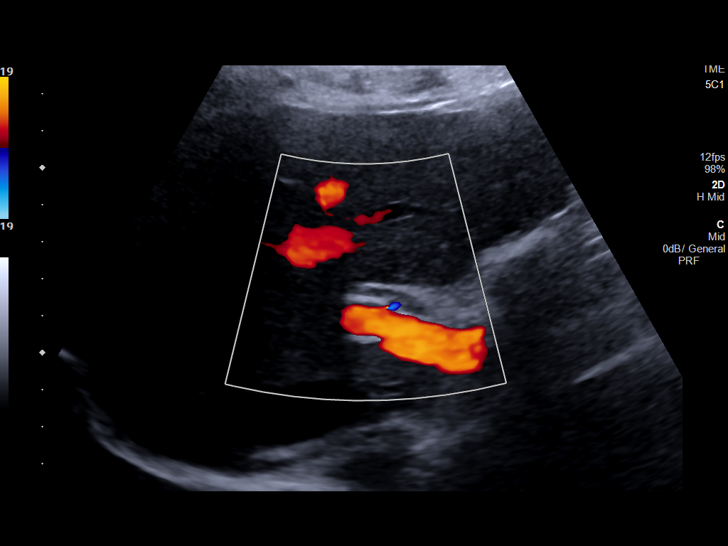
[im 37/69]
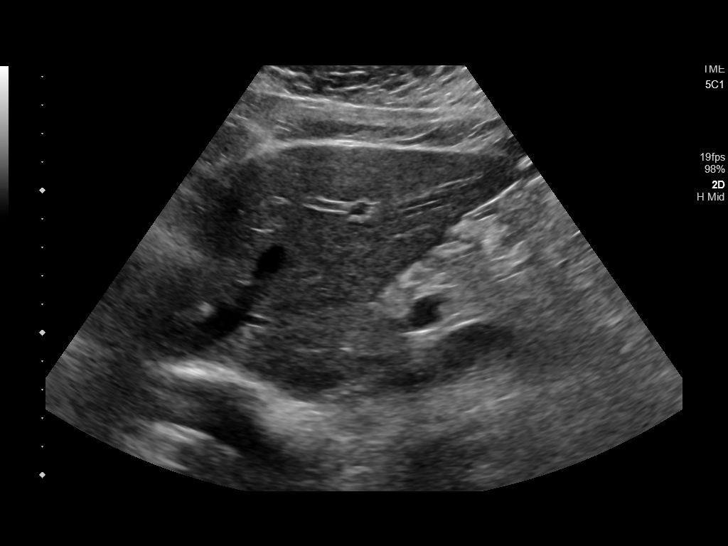
[im 43/69]
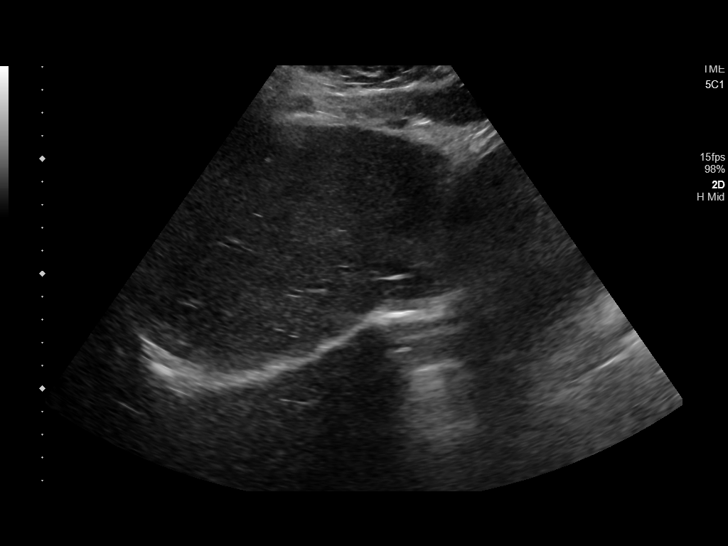
[im 46/69]
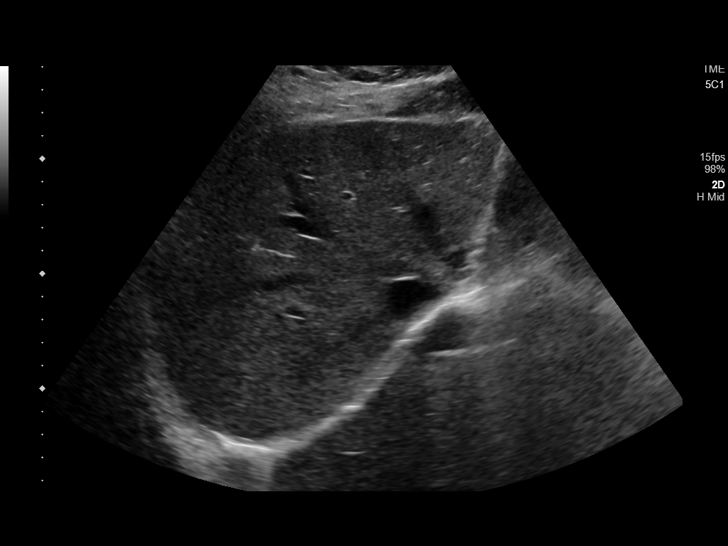
[im 52/69]
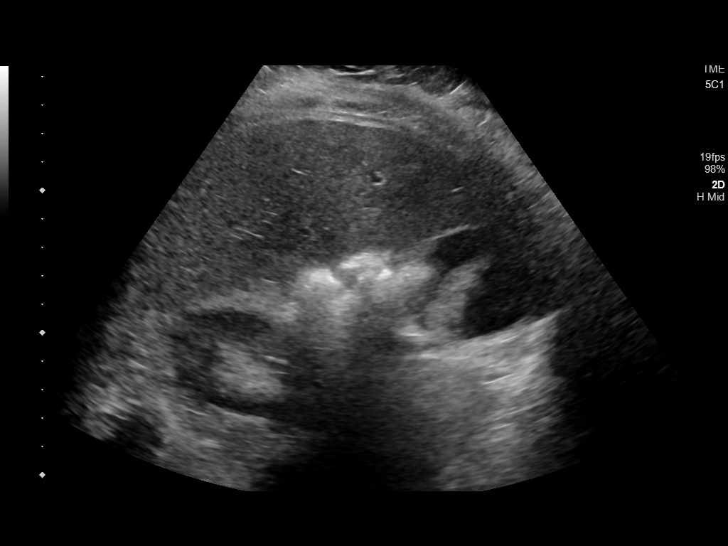
[im 57/69]
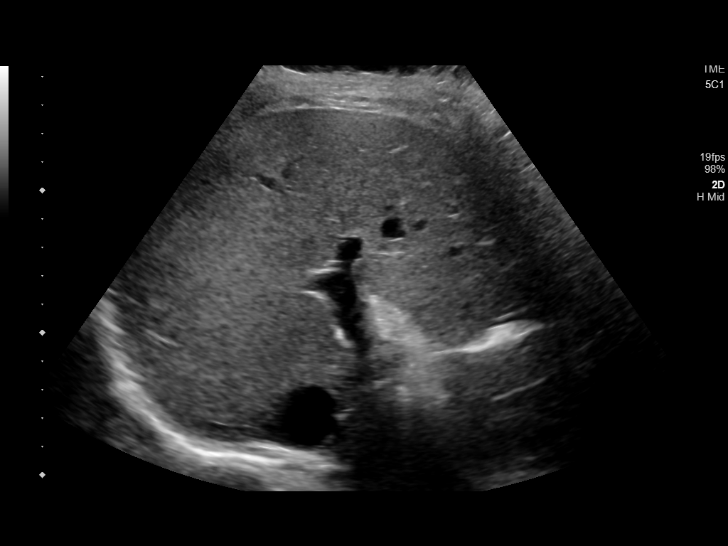
[im 63/69]
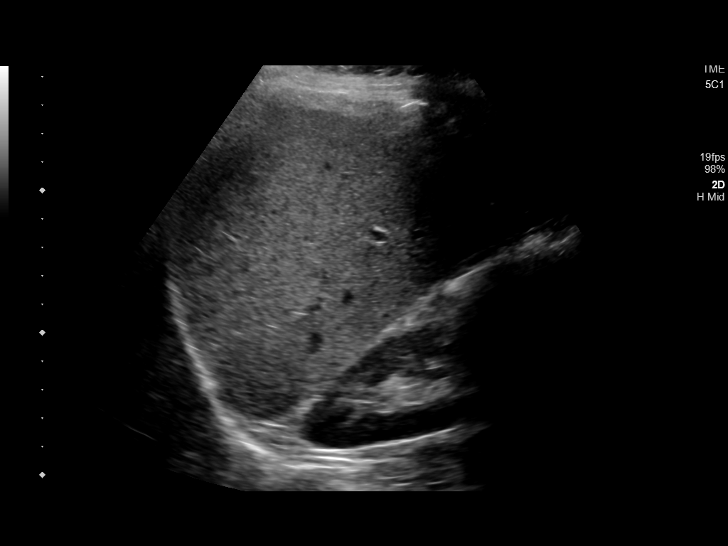
[im 69/69]
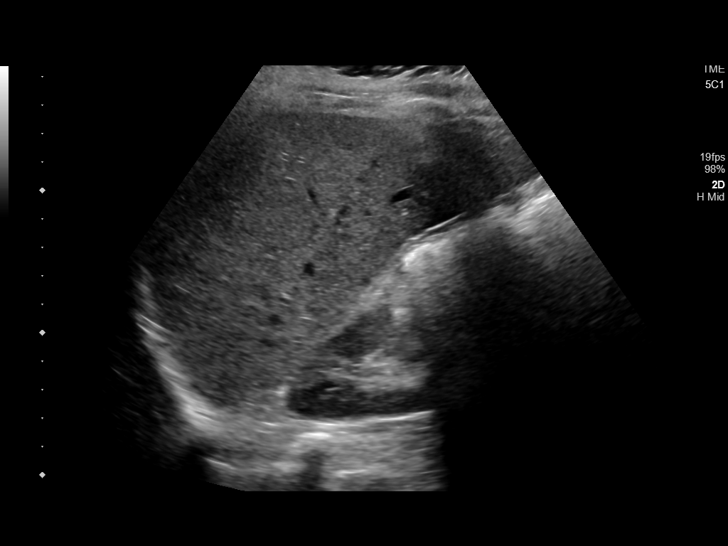

[14 of 25 positions shown; findings below may reference images not displayed]

FINDINGS: Gallbladder:

There is sludge and stones within the gallbladder. There is no
gallbladder wall thickening or pericholecystic fluid. Negative
sonographic Murphy's sign.

Common bile duct:

Diameter: 2 mm

Liver:

Minimally increased echogenicity may represent mild fatty
infiltration. Portal vein is patent on color Doppler imaging with
normal direction of blood flow towards the liver.
IMPRESSION: Cholelithiasis without sonographic evidence of acute cholecystitis.

## 2019-03-19 IMAGING — DX DG ABDOMEN 1V
2 series · 2 of 2 positions shown · non-contrast
Comparison: Abdominal ultrasound March 09, 2018

CLINICAL DATA: Acute onset RIGHT flank pain.

EXAM:
ABDOMEN - 1 VIEW

[abdomen supine (1 of 2)]
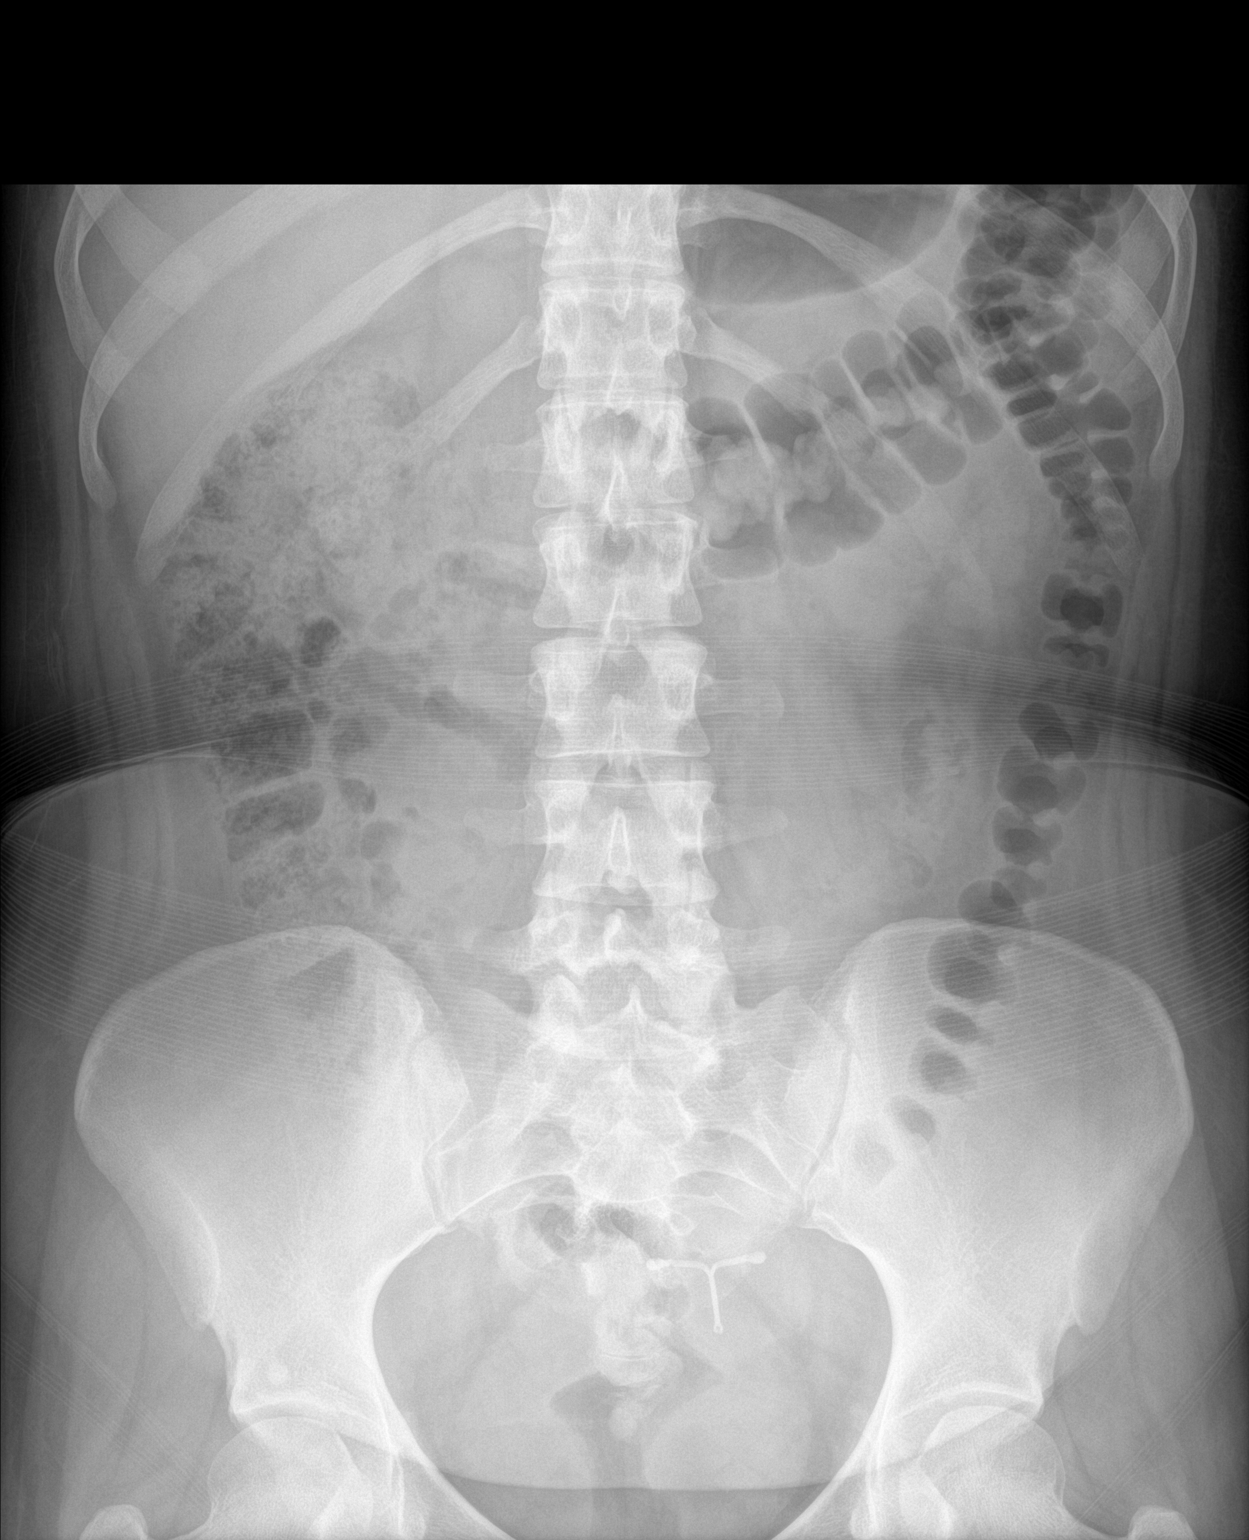

[abdomen supine (2 of 2)]
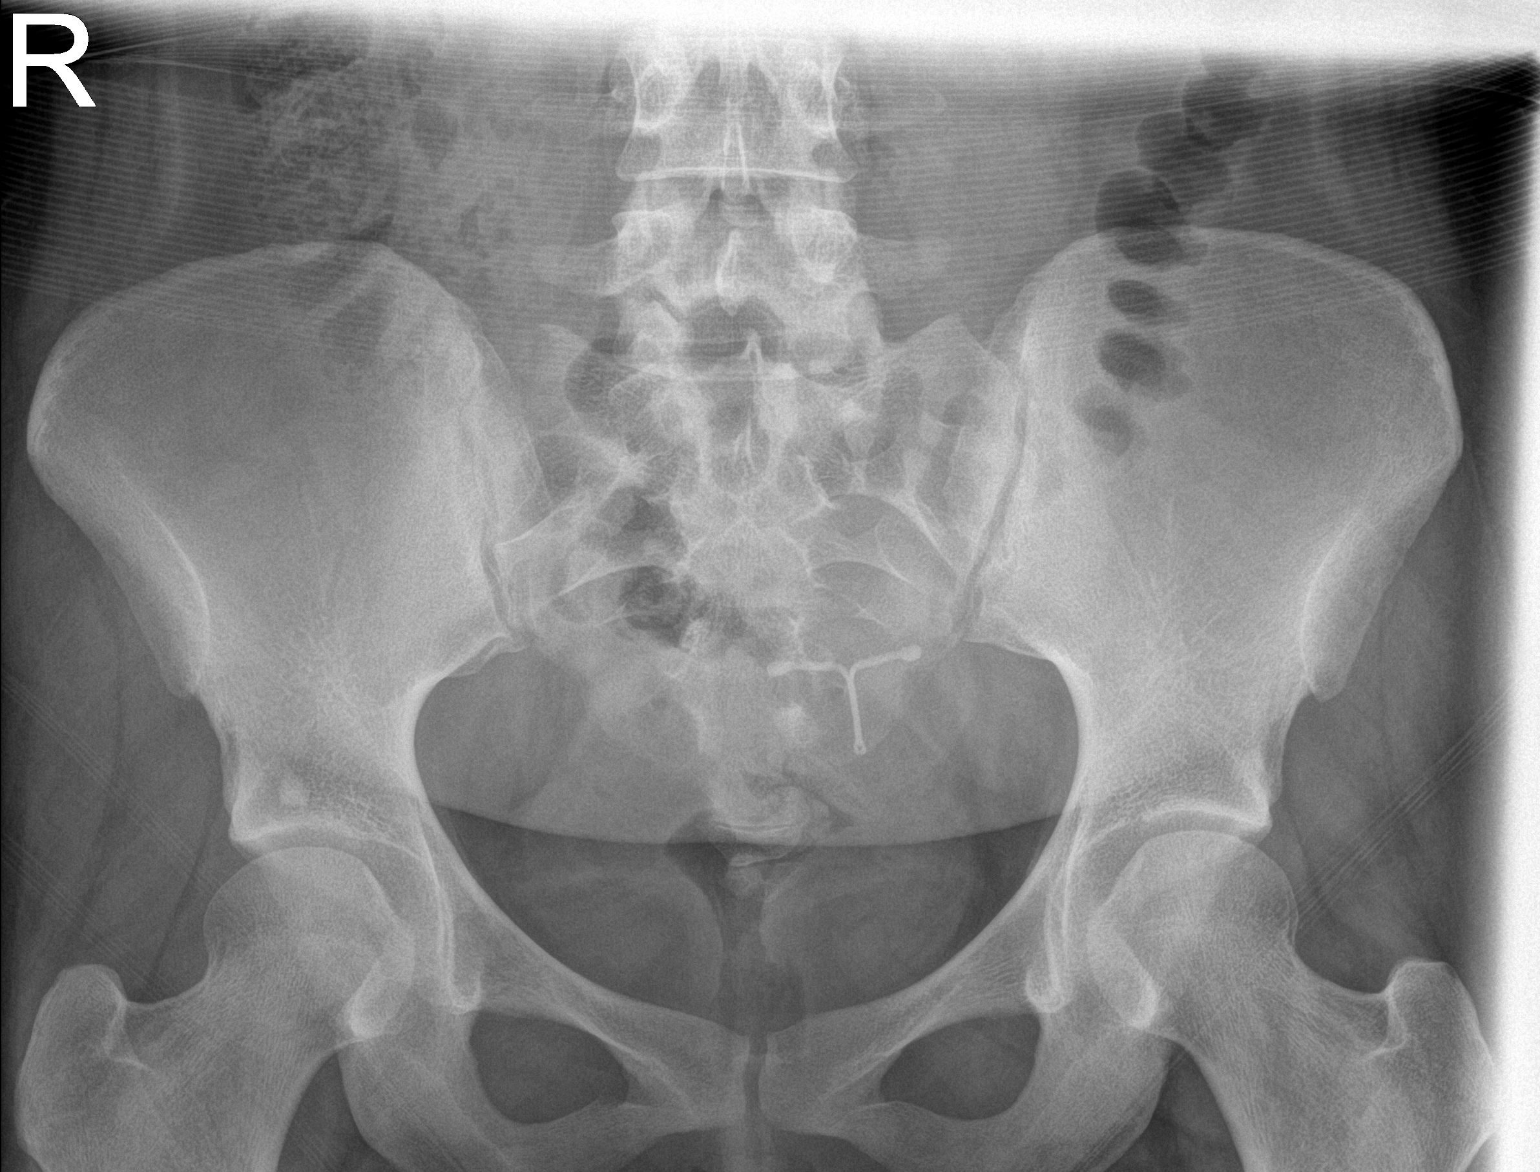

[2 of 2 positions shown; findings below may reference images not displayed]

FINDINGS: The bowel gas pattern is normal. No significant retained large bowel
stool. No radio-opaque calculi or other significant radiographic
abnormality are seen. IUD projects in LEFT pelvis. Subcentimeter
probable bone island RIGHT acetabulum.
IMPRESSION: Negative.

## 2019-07-15 ENCOUNTER — Ambulatory Visit: Payer: Medicaid Other | Attending: Internal Medicine

## 2019-07-15 DIAGNOSIS — Z20822 Contact with and (suspected) exposure to covid-19: Secondary | ICD-10-CM

## 2019-07-17 LAB — NOVEL CORONAVIRUS, NAA: SARS-CoV-2, NAA: NOT DETECTED

## 2019-08-02 ENCOUNTER — Ambulatory Visit: Payer: Medicaid Other | Attending: Internal Medicine

## 2019-08-02 DIAGNOSIS — Z20822 Contact with and (suspected) exposure to covid-19: Secondary | ICD-10-CM

## 2019-08-03 LAB — NOVEL CORONAVIRUS, NAA: SARS-CoV-2, NAA: NOT DETECTED
